# Patient Record
Sex: Male | Born: 1960 | Race: White | Hispanic: No | Marital: Married
Health system: Southern US, Community
[De-identification: ages and names within clinical notes are randomized; demographics above are authoritative.]

---

## 2019-07-08 ENCOUNTER — Encounter (HOSPITAL_COMMUNITY): Payer: Self-pay | Admitting: Emergency Medicine

## 2019-07-08 ENCOUNTER — Emergency Department (HOSPITAL_COMMUNITY)
Admission: EM | Admit: 2019-07-08 | Discharge: 2019-07-08 | Disposition: A | Discharge status: Home / Self Care | Payer: BLUE CROSS/BLUE SHIELD | Attending: Emergency Medicine | Admitting: Emergency Medicine

## 2019-07-08 ENCOUNTER — Other Ambulatory Visit: Payer: Self-pay

## 2019-07-08 ENCOUNTER — Emergency Department (HOSPITAL_COMMUNITY): Payer: BLUE CROSS/BLUE SHIELD

## 2019-07-08 DIAGNOSIS — M545 Low back pain, unspecified: Secondary | ICD-10-CM

## 2019-07-08 LAB — URINALYSIS, ROUTINE W REFLEX MICROSCOPIC
Bilirubin Urine: NEGATIVE
Glucose, UA: NEGATIVE mg/dL
Hgb urine dipstick: NEGATIVE
Ketones, ur: NEGATIVE mg/dL
Leukocytes,Ua: NEGATIVE
Nitrite: NEGATIVE
Protein, ur: NEGATIVE mg/dL
Specific Gravity, Urine: 1.028 (ref 1.005–1.030)
pH: 5 (ref 5.0–8.0)

## 2019-07-08 MED ORDER — LIDOCAINE 5 % EX PTCH
1.0000 | MEDICATED_PATCH | Freq: Once | CUTANEOUS | Status: DC
  Administered 2019-07-08: 1 via TRANSDERMAL
  Filled 2019-07-08: qty 1

## 2019-07-08 MED ORDER — CYCLOBENZAPRINE HCL 10 MG PO TABS: 10.0000 mg | ORAL_TABLET | Freq: Two times a day (BID) | ORAL | 0 refills | Status: AC | PRN

## 2019-07-08 NOTE — ED Notes (Signed)
Urine culture sent with urine °

## 2019-07-08 NOTE — ED Provider Notes (Signed)
[facility]-EMERGENCY DEPT Provider Note   CSN: [id number] Arrival date & time: 07/08/19  0349     History Chief Complaint  Patient presents with  . Back Pain    Dakota Kaufman is a 59 y.o. male with past medical history significant for arthritis presents to emergency room today with chief complaint of progressively worsening left lower back pain x4 days.  Patient states he is here from [place] visiting.  He states his pain occasionally radiates to his left groin.  He states the pain is sharp.  Pain is intermittent.  He took Aleve for the pain without any symptom improvement.  He rates the pain 10 out of 10 in severity at its worse.  Pain right now is 4 out of 10.  He admits to difficulty sleeping last night because he cannot get comfortable because of the pain.  Patient states he has an orthopedist at home and is aware he needs a hip replacement that has not yet scheduled this as it was recommended he try to lose weight first.  Denies fevers, weight loss, numbness/weakness of upper and lower extremities, bowel/bladder incontinence, urinary retention, history of cancer, saddle anesthesia, history of back surgery, history of IVDA, abdominal pain, nausea, vomiting, gross hematuria, urinary frequency, dysuria, testicular pain.  Denies history of kidney stones.  History reviewed. No pertinent past medical history.  There are no problems to display for this patient.   History reviewed. No pertinent surgical history.     No family history on file.  Social History   Tobacco Use  . Smoking status: Not on file  Substance Use Topics  . Alcohol use: Not on file  . Drug use: Not on file    Home Medications Prior to Admission medications   Medication Sig Start Date End Date Taking? Authorizing Provider  cyclobenzaprine (FLEXERIL) 10 MG tablet Take 1 tablet (10 mg total) by mouth 2 (two) times daily as needed for up to 7 days for muscle spasms. 07/08/19 07/15/19   Jacari Iannello, Caroleen Hamman, PA-C    Allergies    Patient has no known allergies.  Review of Systems   Review of Systems All other systems are reviewed and are negative for acute change except as noted in the HPI.  Physical Exam Updated Vital Signs BP (!) 143/83 (BP Location: Left Arm)   Pulse 73   Temp 97.7 F (36.5 C) (Oral)   Resp 16   SpO2 100%   Physical Exam Vitals and nursing note reviewed.  Constitutional:      Appearance: He is well-developed. He is not ill-appearing or toxic-appearing.  HENT:     Head: Normocephalic and atraumatic.     Nose: Nose normal.  Eyes:     General: No scleral icterus.       Right eye: No discharge.        Left eye: No discharge.     Conjunctiva/sclera: Conjunctivae normal.  Neck:     Vascular: No JVD.  Cardiovascular:     Rate and Rhythm: Normal rate and regular rhythm.     Pulses: Normal pulses.          Dorsalis pedis pulses are 2+ on the right side and 2+ on the left side.     Heart sounds: Normal heart sounds.  Pulmonary:     Effort: Pulmonary effort is normal.     Breath sounds: Normal breath sounds.  Abdominal:     General: There is no distension.  Palpations: There is no mass.     Tenderness: There is no right CVA tenderness or left CVA tenderness.     Hernia: No hernia is present.  Musculoskeletal:        General: Normal range of motion.     Cervical back: Normal range of motion.       Back:     Right lower leg: No edema.     Left lower leg: No edema.     Comments: Tenderness to palpation as depicted in image above.  Full range of motion of the T-spine and L-spine No tenderness to palpation of the spinous processes of the T-spine or L-spine No crepitus, deformity or step-offs + straight leg raise test on left.  Ambulates with cane with steady gait    Skin:    General: Skin is warm and dry.  Neurological:     Mental Status: He is oriented to person, place, and time.     GCS: GCS eye subscore is 4. GCS verbal  subscore is 5. GCS motor subscore is 6.     Comments: Speech is clear and goal oriented, follows commands CN III-XII intact, no facial droop Normal strength in upper and lower extremities bilaterally including dorsiflexion and plantar flexion, strong and equal grip strength Sensation normal to light and sharp touch Moves extremities without ataxia, coordination intact Normal finger to nose and rapid alternating movements   Psychiatric:        Behavior: Behavior normal.     ED Results / Procedures / Treatments   Labs (all labs ordered are listed, but only abnormal results are displayed) Labs Reviewed  URINALYSIS, ROUTINE W REFLEX MICROSCOPIC    EKG None  Radiology DG Lumbar Spine Complete  Result Date: 07/08/2019 CLINICAL DATA:  59 year old male with back pain. EXAM: LUMBAR SPINE - COMPLETE 4+ VIEW COMPARISON:  None. FINDINGS: There is no acute fracture or dislocation. Mild joint space narrowing primarily at L3-L4, L4-L5, and L5-S1. Multilevel anterior spurring. Lower lumbar facet arthropathy. There is atherosclerotic calcification of the abdominal aorta. 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 The soft tissues are unremarkable. IMPRESSION: 1. No acute fracture or dislocation. 2. Mild multilevel degenerative changes of the lumbar spine. Electronically Signed   By: Anner Crete M.D.   On: 07/08/2019 15:24    Procedures Procedures (including critical care time)  Medications Ordered in ED Medications  lidocaine (LIDODERM) 5 % 1 patch (1 patch Transdermal Patch Applied 07/08/19 1534)    ED Course  I have reviewed the triage vital signs and the nursing notes.  Pertinent labs & imaging results that were available during my care of the patient were reviewed by me and considered in my medical decision making (see chart for details).    MDM Rules/Calculators/A&P                     History provided by patient with additional history obtained from chart review.      Patient seen and examined. Patient presents awake, alert, hemodynamically stable, afebrile, non toxic.  On exam he is well-appearing, no acute distress.  No neurological deficits and neuro exam normal.  No loss of bowel or bladder control, no red flag, no concern for cauda equina.  He is tenderness to palpation of left paraspinal muscles.  No midline tenderness.  He ambulates with a cane which she has been using for several months now and has no gait instability.  He also has positive straight leg raise  on the left.  Given he said pain will occasionally radiate to his groin UA collected and is negative for any blood or RBCs making kidney stone unlikely cause of his pain.  X-ray lumbar spine shows degenerative changes, no acute fracture or dislocation.  Updated patient on results.  He is agreeable with plan for a Lidoderm patch and prescription for Flexeril.  He is aware muscle relaxers can make him drowsy and he should not drive or work when taking.   The patient appears reasonably screened and/or stabilized for discharge and I doubt any other medical condition or other [facility] requiring further screening, evaluation, or treatment in the ED at this time prior to discharge. The patient is safe for discharge with strict return precautions discussed. Recommend pcp follow up if symptoms persist.  Portions of this note were generated with Dragon dictation software. Dictation errors may occur despite best attempts at proofreading.   Final Clinical Impression(s) / ED Diagnoses Final diagnoses:  Acute left-sided low back pain without sciatica    Rx / DC Orders ED Discharge Orders         Ordered    cyclobenzaprine (FLEXERIL) 10 MG tablet  2 times daily PRN     07/08/19 1534           Sherene Sires, PA-C 07/08/19 1539    Terald Sleeper, MD 07/08/19 1819

## 2019-07-08 NOTE — Discharge Instructions (Addendum)
You have been seen today for back pain. Please read and follow all provided instructions. Return to the emergency room for worsening condition or new concerning symptoms.    Your urine sample did not show any signs of infection or blood to suggest a kidney stone. -Your x-ray did not show any acute fracture or broken bones in your back.  1. Medications:  Prescription to your pharmacy for Flexeril.  This is a muscle relaxer.  Please take as prescribed and only if needed.  This and make you drowsy so do not drive or work when taking.  Continue usual home medications Take medications as prescribed. Please review all of the medicines and only take them if you do not have an allergy to them.   2. Treatment: rest, drink plenty of fluids  3. Follow Up:  Please follow up with primary care provider by scheduling an appointment as soon as possible for a visit    It is also a possibility that you have an allergic reaction to any of the medicines that you have been prescribed - Everybody reacts differently to medications and while MOST people have no trouble with most medicines, you may have a reaction such as nausea, vomiting, rash, swelling, shortness of breath. If this is the case, please stop taking the medicine immediately and contact your physician.  ?

## 2019-07-08 NOTE — ED Triage Notes (Signed)
Pt c/o left back spasm since yesterday. Denies any fall or injuries or urinary or bowel problems. Reports needs a hip replacement on the left hip.

## 2021-06-22 IMAGING — CR DG LUMBAR SPINE COMPLETE 4+V
5 series · 5 of 5 positions shown · non-contrast
Comparison: None.

CLINICAL DATA: 58-year-old male with back pain.

EXAM:
LUMBAR SPINE - COMPLETE 4+ VIEW

[t lumbar spine ap]
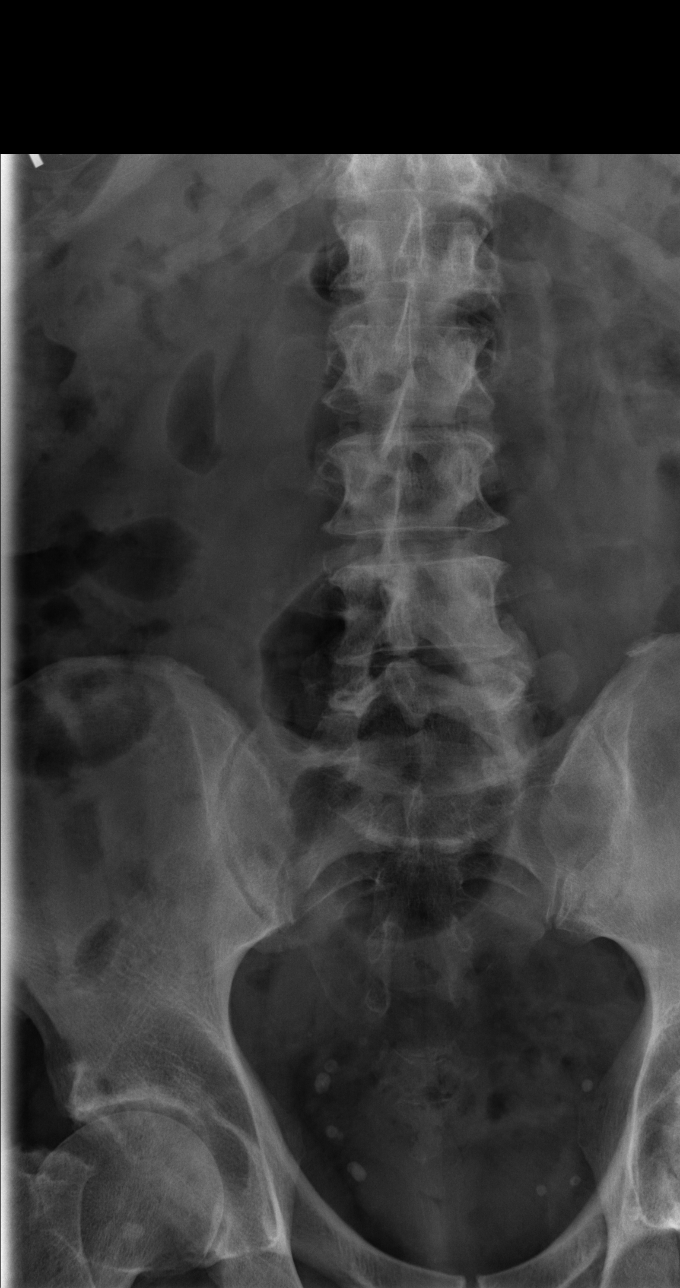

[t lumbar spine obl (1 of 2)]
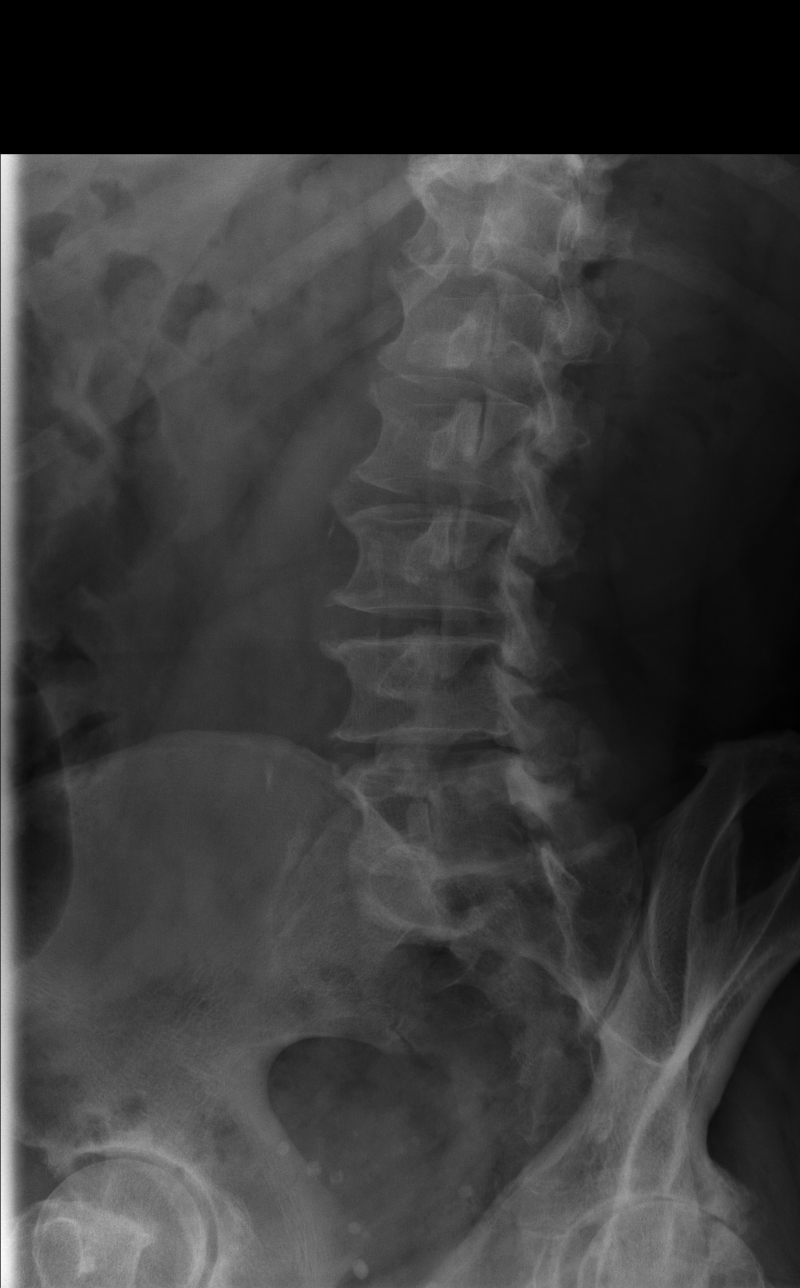

[t lumbar spine obl (2 of 2)]
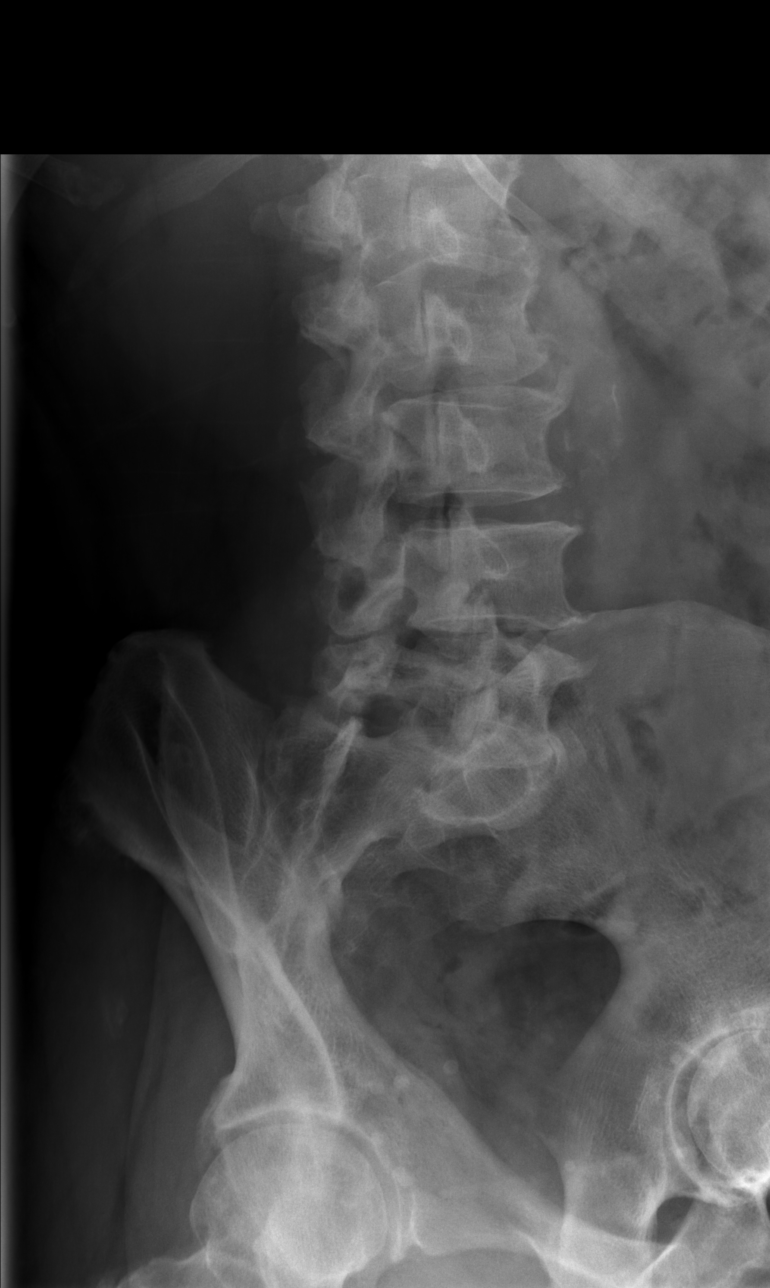

[t lumbar spine lat]
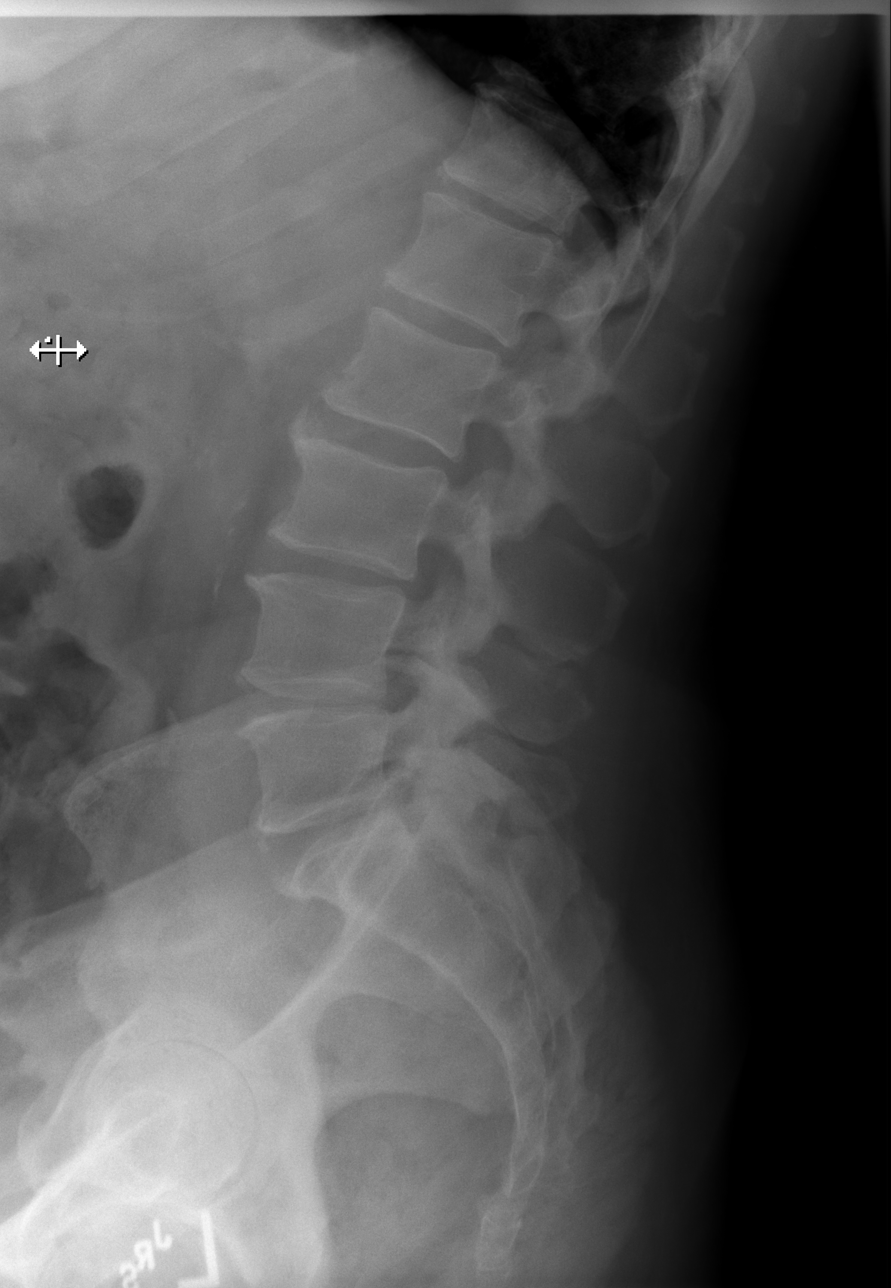

[t lumbar l-5 s-1 spot]
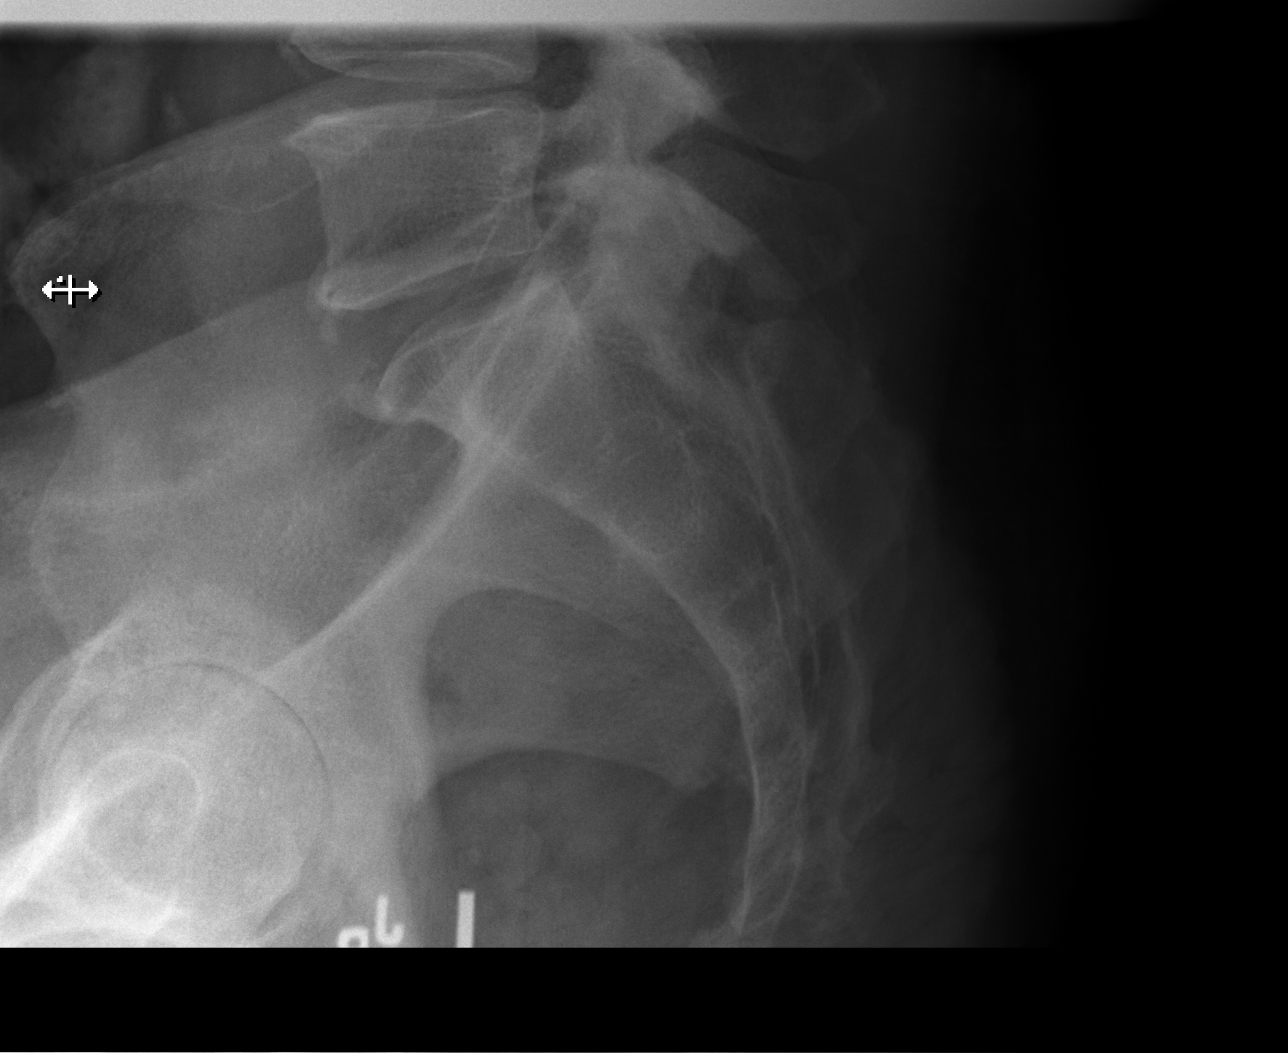

[5 of 5 positions shown; findings below may reference images not displayed]

FINDINGS: There is no acute fracture or dislocation. Mild joint space
narrowing primarily at L3-L4, L4-L5, and L5-S1. Multilevel anterior
spurring. Lower lumbar facet arthropathy. There is atherosclerotic
calcification of the abdominal aorta. There is osteoarthritic
changes of the hips bilaterally. The soft tissues are unremarkable.
IMPRESSION: 1. No acute fracture or dislocation.
2. Mild multilevel degenerative changes of the lumbar spine.
